# Patient Record
Sex: Male | Born: 1990 | Hispanic: Yes
Health system: Southern US, Community
[De-identification: ages and names within clinical notes are randomized; demographics above are authoritative.]

## PROBLEM LIST (undated history)

## (undated) ENCOUNTER — Emergency Department (HOSPITAL_COMMUNITY): Discharge status: Absent without leave | Payer: Self-pay

---

## 2019-06-03 ENCOUNTER — Encounter (HOSPITAL_COMMUNITY): Payer: Self-pay

## 2019-06-03 ENCOUNTER — Emergency Department (HOSPITAL_COMMUNITY): Payer: No Typology Code available for payment source

## 2019-06-03 ENCOUNTER — Emergency Department (HOSPITAL_COMMUNITY)
Admission: EM | Admit: 2019-06-03 | Discharge: 2019-06-03 | Discharge status: Against Medical Advice | Payer: No Typology Code available for payment source | Attending: Emergency Medicine | Admitting: Emergency Medicine

## 2019-06-03 DIAGNOSIS — Y999 Unspecified external cause status: Secondary | ICD-10-CM | POA: Diagnosis not present

## 2019-06-03 DIAGNOSIS — Y92410 Unspecified street and highway as the place of occurrence of the external cause: Secondary | ICD-10-CM | POA: Insufficient documentation

## 2019-06-03 DIAGNOSIS — S8992XA Unspecified injury of left lower leg, initial encounter: Secondary | ICD-10-CM | POA: Diagnosis present

## 2019-06-03 DIAGNOSIS — R0682 Tachypnea, not elsewhere classified: Secondary | ICD-10-CM | POA: Insufficient documentation

## 2019-06-03 DIAGNOSIS — Y939 Activity, unspecified: Secondary | ICD-10-CM | POA: Insufficient documentation

## 2019-06-03 DIAGNOSIS — S60414A Abrasion of right ring finger, initial encounter: Secondary | ICD-10-CM | POA: Diagnosis not present

## 2019-06-03 DIAGNOSIS — S80812A Abrasion, left lower leg, initial encounter: Secondary | ICD-10-CM | POA: Insufficient documentation

## 2019-06-03 DIAGNOSIS — S60416A Abrasion of right little finger, initial encounter: Secondary | ICD-10-CM | POA: Insufficient documentation

## 2019-06-03 MED ORDER — TETANUS-DIPHTH-ACELL PERTUSSIS 5-2.5-18.5 LF-MCG/0.5 IM SUSP
0.5000 mL | Freq: Once | INTRAMUSCULAR | Status: DC
  Filled 2019-06-03: qty 0.5

## 2019-06-03 MED ORDER — LACTATED RINGERS IV BOLUS: 1000.0000 mL | Freq: Once | INTRAVENOUS | Status: DC

## 2019-06-03 NOTE — ED Notes (Signed)
Pt stated he would like to leave AMA; EDP Charm Barges made aware. Pt transported to XRAY in the meantime. Pt consented to radiology.

## 2019-06-03 NOTE — ED Notes (Signed)
Pt made this RN aware that he would like to leave AMA. EDP made aware and pt made aware of risks of leaving. Pt understands risks and is still requesting to leave. PIV already removed by pt and pt signed AMA form.
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
## 2019-06-03 NOTE — ED Notes (Signed)
EDP at Bedside 

## 2019-06-03 NOTE — ED Notes (Addendum)
Pt refused further work-up including medications, further scans, and reassessment.
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
